# Patient Record
Sex: Female | Born: 1985 | Race: Black or African American | Hispanic: No | Marital: Single | Smoking: Never smoker
Health system: Southern US, Community
[De-identification: ages and names within clinical notes are randomized; demographics above are authoritative.]

## PROBLEM LIST (undated history)

## (undated) DIAGNOSIS — E669 Obesity, unspecified: Secondary | ICD-10-CM

## (undated) DIAGNOSIS — E282 Polycystic ovarian syndrome: Secondary | ICD-10-CM

## (undated) DIAGNOSIS — D649 Anemia, unspecified: Secondary | ICD-10-CM

## (undated) HISTORY — DX: Anemia, unspecified: D64.9

## (undated) HISTORY — DX: Polycystic ovarian syndrome: E28.2

## (undated) HISTORY — PX: TONSILLECTOMY: SUR1361

## (undated) HISTORY — DX: Obesity, unspecified: E66.9

---

## 2019-05-18 DIAGNOSIS — F4323 Adjustment disorder with mixed anxiety and depressed mood: Secondary | ICD-10-CM | POA: Diagnosis not present

## 2019-05-22 DIAGNOSIS — Z20828 Contact with and (suspected) exposure to other viral communicable diseases: Secondary | ICD-10-CM | POA: Diagnosis not present

## 2019-06-02 DIAGNOSIS — F4323 Adjustment disorder with mixed anxiety and depressed mood: Secondary | ICD-10-CM | POA: Diagnosis not present

## 2019-06-15 DIAGNOSIS — L821 Other seborrheic keratosis: Secondary | ICD-10-CM | POA: Diagnosis not present

## 2019-06-15 DIAGNOSIS — E282 Polycystic ovarian syndrome: Secondary | ICD-10-CM | POA: Diagnosis not present

## 2019-06-15 DIAGNOSIS — D239 Other benign neoplasm of skin, unspecified: Secondary | ICD-10-CM | POA: Diagnosis not present

## 2019-06-16 DIAGNOSIS — F4323 Adjustment disorder with mixed anxiety and depressed mood: Secondary | ICD-10-CM | POA: Diagnosis not present

## 2019-06-30 DIAGNOSIS — F331 Major depressive disorder, recurrent, moderate: Secondary | ICD-10-CM | POA: Diagnosis not present

## 2019-07-07 DIAGNOSIS — Z20828 Contact with and (suspected) exposure to other viral communicable diseases: Secondary | ICD-10-CM | POA: Diagnosis not present

## 2019-07-14 DIAGNOSIS — Z01419 Encounter for gynecological examination (general) (routine) without abnormal findings: Secondary | ICD-10-CM | POA: Diagnosis not present

## 2019-07-14 DIAGNOSIS — Z1151 Encounter for screening for human papillomavirus (HPV): Secondary | ICD-10-CM | POA: Diagnosis not present

## 2019-07-14 DIAGNOSIS — Z6841 Body Mass Index (BMI) 40.0 and over, adult: Secondary | ICD-10-CM | POA: Diagnosis not present

## 2019-07-15 DIAGNOSIS — E611 Iron deficiency: Secondary | ICD-10-CM | POA: Diagnosis not present

## 2019-07-15 DIAGNOSIS — Z0000 Encounter for general adult medical examination without abnormal findings: Secondary | ICD-10-CM | POA: Diagnosis not present

## 2019-07-21 DIAGNOSIS — F331 Major depressive disorder, recurrent, moderate: Secondary | ICD-10-CM | POA: Diagnosis not present

## 2019-08-04 DIAGNOSIS — F331 Major depressive disorder, recurrent, moderate: Secondary | ICD-10-CM | POA: Diagnosis not present

## 2019-08-10 DIAGNOSIS — N920 Excessive and frequent menstruation with regular cycle: Secondary | ICD-10-CM | POA: Diagnosis not present

## 2019-08-10 DIAGNOSIS — R9389 Abnormal findings on diagnostic imaging of other specified body structures: Secondary | ICD-10-CM | POA: Diagnosis not present

## 2019-08-17 DIAGNOSIS — F331 Major depressive disorder, recurrent, moderate: Secondary | ICD-10-CM | POA: Diagnosis not present

## 2019-08-24 DIAGNOSIS — R7989 Other specified abnormal findings of blood chemistry: Secondary | ICD-10-CM | POA: Diagnosis not present

## 2019-08-24 DIAGNOSIS — R7303 Prediabetes: Secondary | ICD-10-CM | POA: Diagnosis not present

## 2019-08-24 DIAGNOSIS — R635 Abnormal weight gain: Secondary | ICD-10-CM | POA: Diagnosis not present

## 2019-08-24 DIAGNOSIS — E782 Mixed hyperlipidemia: Secondary | ICD-10-CM | POA: Diagnosis not present

## 2019-08-24 DIAGNOSIS — D509 Iron deficiency anemia, unspecified: Secondary | ICD-10-CM | POA: Diagnosis not present

## 2019-08-24 DIAGNOSIS — E039 Hypothyroidism, unspecified: Secondary | ICD-10-CM | POA: Diagnosis not present

## 2019-08-30 DIAGNOSIS — R7303 Prediabetes: Secondary | ICD-10-CM | POA: Diagnosis not present

## 2019-08-30 DIAGNOSIS — Z1331 Encounter for screening for depression: Secondary | ICD-10-CM | POA: Diagnosis not present

## 2019-08-30 DIAGNOSIS — R635 Abnormal weight gain: Secondary | ICD-10-CM | POA: Diagnosis not present

## 2019-08-30 DIAGNOSIS — E039 Hypothyroidism, unspecified: Secondary | ICD-10-CM | POA: Diagnosis not present

## 2019-08-30 DIAGNOSIS — E782 Mixed hyperlipidemia: Secondary | ICD-10-CM | POA: Diagnosis not present

## 2019-08-30 DIAGNOSIS — Z1339 Encounter for screening examination for other mental health and behavioral disorders: Secondary | ICD-10-CM | POA: Diagnosis not present

## 2019-09-07 DIAGNOSIS — F331 Major depressive disorder, recurrent, moderate: Secondary | ICD-10-CM | POA: Diagnosis not present

## 2019-10-04 DIAGNOSIS — N938 Other specified abnormal uterine and vaginal bleeding: Secondary | ICD-10-CM | POA: Diagnosis not present

## 2019-10-04 DIAGNOSIS — R9389 Abnormal findings on diagnostic imaging of other specified body structures: Secondary | ICD-10-CM | POA: Diagnosis not present

## 2019-10-08 DIAGNOSIS — E282 Polycystic ovarian syndrome: Secondary | ICD-10-CM | POA: Diagnosis not present

## 2019-10-08 DIAGNOSIS — D509 Iron deficiency anemia, unspecified: Secondary | ICD-10-CM | POA: Diagnosis not present

## 2019-10-08 DIAGNOSIS — R7303 Prediabetes: Secondary | ICD-10-CM | POA: Diagnosis not present

## 2019-11-26 DIAGNOSIS — Z20822 Contact with and (suspected) exposure to covid-19: Secondary | ICD-10-CM | POA: Diagnosis not present

## 2019-12-06 DIAGNOSIS — F331 Major depressive disorder, recurrent, moderate: Secondary | ICD-10-CM | POA: Diagnosis not present

## 2020-01-03 DIAGNOSIS — E7800 Pure hypercholesterolemia, unspecified: Secondary | ICD-10-CM | POA: Diagnosis not present

## 2020-03-03 DIAGNOSIS — L03116 Cellulitis of left lower limb: Secondary | ICD-10-CM | POA: Diagnosis not present

## 2020-03-09 DIAGNOSIS — D649 Anemia, unspecified: Secondary | ICD-10-CM | POA: Diagnosis not present

## 2020-03-09 DIAGNOSIS — Z0000 Encounter for general adult medical examination without abnormal findings: Secondary | ICD-10-CM | POA: Diagnosis not present

## 2020-03-09 DIAGNOSIS — E7800 Pure hypercholesterolemia, unspecified: Secondary | ICD-10-CM | POA: Diagnosis not present

## 2020-03-09 DIAGNOSIS — R7303 Prediabetes: Secondary | ICD-10-CM | POA: Diagnosis not present

## 2020-03-16 DIAGNOSIS — F438 Other reactions to severe stress: Secondary | ICD-10-CM | POA: Diagnosis not present

## 2020-03-23 DIAGNOSIS — F438 Other reactions to severe stress: Secondary | ICD-10-CM | POA: Diagnosis not present

## 2020-03-30 DIAGNOSIS — F438 Other reactions to severe stress: Secondary | ICD-10-CM | POA: Diagnosis not present

## 2020-04-06 DIAGNOSIS — F438 Other reactions to severe stress: Secondary | ICD-10-CM | POA: Diagnosis not present

## 2020-04-13 DIAGNOSIS — F438 Other reactions to severe stress: Secondary | ICD-10-CM | POA: Diagnosis not present

## 2020-04-27 DIAGNOSIS — F438 Other reactions to severe stress: Secondary | ICD-10-CM | POA: Diagnosis not present

## 2020-05-04 DIAGNOSIS — F438 Other reactions to severe stress: Secondary | ICD-10-CM | POA: Diagnosis not present

## 2020-05-11 DIAGNOSIS — F438 Other reactions to severe stress: Secondary | ICD-10-CM | POA: Diagnosis not present

## 2020-05-16 DIAGNOSIS — Z20822 Contact with and (suspected) exposure to covid-19: Secondary | ICD-10-CM | POA: Diagnosis not present

## 2020-05-20 DIAGNOSIS — F438 Other reactions to severe stress: Secondary | ICD-10-CM | POA: Diagnosis not present

## 2020-05-27 DIAGNOSIS — F438 Other reactions to severe stress: Secondary | ICD-10-CM | POA: Diagnosis not present

## 2020-05-29 DIAGNOSIS — R946 Abnormal results of thyroid function studies: Secondary | ICD-10-CM | POA: Diagnosis not present

## 2020-05-29 DIAGNOSIS — R7303 Prediabetes: Secondary | ICD-10-CM | POA: Diagnosis not present

## 2020-05-29 DIAGNOSIS — E7800 Pure hypercholesterolemia, unspecified: Secondary | ICD-10-CM | POA: Diagnosis not present

## 2020-05-29 DIAGNOSIS — D649 Anemia, unspecified: Secondary | ICD-10-CM | POA: Diagnosis not present

## 2020-06-03 DIAGNOSIS — F438 Other reactions to severe stress: Secondary | ICD-10-CM | POA: Diagnosis not present

## 2020-06-19 DIAGNOSIS — H5213 Myopia, bilateral: Secondary | ICD-10-CM | POA: Diagnosis not present

## 2020-06-19 DIAGNOSIS — H5212 Myopia, left eye: Secondary | ICD-10-CM | POA: Diagnosis not present

## 2020-06-19 DIAGNOSIS — H52223 Regular astigmatism, bilateral: Secondary | ICD-10-CM | POA: Diagnosis not present

## 2020-06-23 DIAGNOSIS — F438 Other reactions to severe stress: Secondary | ICD-10-CM | POA: Diagnosis not present

## 2020-08-18 DIAGNOSIS — Z20822 Contact with and (suspected) exposure to covid-19: Secondary | ICD-10-CM | POA: Diagnosis not present

## 2020-08-18 DIAGNOSIS — Z03818 Encounter for observation for suspected exposure to other biological agents ruled out: Secondary | ICD-10-CM | POA: Diagnosis not present

## 2020-08-30 DIAGNOSIS — F438 Other reactions to severe stress: Secondary | ICD-10-CM | POA: Diagnosis not present

## 2020-09-13 DIAGNOSIS — F438 Other reactions to severe stress: Secondary | ICD-10-CM | POA: Diagnosis not present

## 2020-09-27 DIAGNOSIS — Z20822 Contact with and (suspected) exposure to covid-19: Secondary | ICD-10-CM | POA: Diagnosis not present

## 2020-09-28 DIAGNOSIS — Z20822 Contact with and (suspected) exposure to covid-19: Secondary | ICD-10-CM | POA: Diagnosis not present

## 2020-11-30 DIAGNOSIS — N920 Excessive and frequent menstruation with regular cycle: Secondary | ICD-10-CM | POA: Diagnosis not present

## 2020-11-30 DIAGNOSIS — R9389 Abnormal findings on diagnostic imaging of other specified body structures: Secondary | ICD-10-CM | POA: Diagnosis not present

## 2020-11-30 DIAGNOSIS — Z6841 Body Mass Index (BMI) 40.0 and over, adult: Secondary | ICD-10-CM | POA: Diagnosis not present

## 2020-11-30 DIAGNOSIS — E282 Polycystic ovarian syndrome: Secondary | ICD-10-CM | POA: Diagnosis not present

## 2021-02-07 DIAGNOSIS — M79671 Pain in right foot: Secondary | ICD-10-CM | POA: Diagnosis not present

## 2021-02-07 DIAGNOSIS — R600 Localized edema: Secondary | ICD-10-CM | POA: Diagnosis not present

## 2021-02-08 DIAGNOSIS — R600 Localized edema: Secondary | ICD-10-CM | POA: Diagnosis not present

## 2021-03-22 DIAGNOSIS — L309 Dermatitis, unspecified: Secondary | ICD-10-CM | POA: Diagnosis not present

## 2021-03-22 DIAGNOSIS — L819 Disorder of pigmentation, unspecified: Secondary | ICD-10-CM | POA: Diagnosis not present

## 2021-05-08 ENCOUNTER — Other Ambulatory Visit: Payer: Self-pay | Admitting: Obstetrics and Gynecology

## 2021-05-08 DIAGNOSIS — N938 Other specified abnormal uterine and vaginal bleeding: Secondary | ICD-10-CM

## 2021-05-22 ENCOUNTER — Ambulatory Visit
Admission: RE | Admit: 2021-05-22 | Discharge: 2021-05-22 | Disposition: A | Discharge status: Home / Self Care | Payer: 59 | Source: Ambulatory Visit | Attending: Obstetrics and Gynecology | Admitting: Obstetrics and Gynecology

## 2021-05-22 DIAGNOSIS — N938 Other specified abnormal uterine and vaginal bleeding: Secondary | ICD-10-CM

## 2021-07-11 ENCOUNTER — Ambulatory Visit (HOSPITAL_BASED_OUTPATIENT_CLINIC_OR_DEPARTMENT_OTHER): Payer: 59 | Admitting: Cardiovascular Disease

## 2021-07-18 ENCOUNTER — Ambulatory Visit (HOSPITAL_BASED_OUTPATIENT_CLINIC_OR_DEPARTMENT_OTHER): Payer: 59 | Admitting: Cardiovascular Disease

## 2021-08-30 ENCOUNTER — Ambulatory Visit (HOSPITAL_BASED_OUTPATIENT_CLINIC_OR_DEPARTMENT_OTHER): Payer: 59 | Admitting: Cardiovascular Disease

## 2021-08-30 NOTE — Progress Notes (Incomplete)
Cardiology Office Note:    Date:  08/30/2021   ID:  Taylor Briggs, DOB 26-Jun-1986, MRN [id number]  PCP:  Renford Dills, MD   [facility] Providers Cardiologist:  None { Click to update primary MD,subspecialty MD or APP then REFRESH:1}    Referring MD: Maxie Better, MD   No chief complaint on file.  History of Present Illness:    Taylor Briggs is a 35 y.o. female with a hx of anemia, here for the evaluation of hypertension.  She was seen at [facility] ED on 05/14/2021 complaining of constant shortness of breath for 3 days prior.  Today,  She denies any palpitations, chest pain, or shortness of breath. No lightheadedness, headaches, syncope, orthopnea, PND, lower extremity edema or exertional symptoms.  (+)  No past medical history on file.  *** The histories are not reviewed yet. Please review them in the "History" navigator section and refresh this SmartLink.  Current Medications: No outpatient medications have been marked as taking for the 08/30/21 encounter (Appointment) with Chilton Si, MD.     Allergies:   Patient has no allergy information on record.   Social History   Socioeconomic History   Marital status: Single    Spouse name: Not on file   Number of children: Not on file   Years of education: Not on file   Highest education level: Not on file  Occupational History   Not on file  Tobacco Use   Smoking status: Not on file   Smokeless tobacco: Not on file  Substance and Sexual Activity   Alcohol use: Not on file   Drug use: Not on file   Sexual activity: Not on file  Other Topics Concern   Not on file  Social History Narrative   Not on file   Social Determinants of Health   Financial Resource Strain: Not on file  Food Insecurity: Not on file  Transportation Needs: Not on file  Physical Activity: Not on file  Stress: Not on file  Social Connections: Not on file     Family History: The patient's family  history is not on file.  ROS:   Please see the history of present illness.     All other systems reviewed and are negative.  EKGs/Labs/Other Studies Reviewed:    The following studies were reviewed today:  CXR ([facility]) 05/14/2021: INDICATION: Shortness of breath.   COMPARISON: No similar studies are available at this institution for comparison   VIEWS: 2   FINDINGS:   LUNGS/PLEURA: Clear. No effusions. Mild elevation of the right hemidiaphragm.   HEART/MEDIASTINUM: The cardiomediastinal silhouette is mildly enlarged.   LIFE SUPPORT LINES: None.   PNEUMOTHORAX: None.   OSSEOUS STRUCTURES: No acute displaced fracture.   SOFT TISSUES: Large body habitus.   IMPRESSION:  Mild enlargement of the cardiomediastinal silhouette. The lungs are clear.   EKG:    08/30/2021: Sinus ***. Rate *** bpm.  Recent Labs: No results found for requested labs within last 8760 hours.   Recent Lipid Panel No results found for: CHOL, TRIG, HDL, CHOLHDL, VLDL, LDLCALC, LDLDIRECT   Risk Assessment/Calculations:   {Does this patient have ATRIAL FIBRILLATION?:[phone]}       Physical Exam:    Wt Readings from Last 3 Encounters:  No data found for Wt     VS:  There were no vitals taken for this visit. , BMI There is no height or weight on file to calculate BMI. GENERAL:  Well appearing HEENT: Pupils equal  round and reactive, fundi not visualized, oral mucosa unremarkable NECK:  No jugular venous distention, waveform within normal limits, carotid upstroke brisk and symmetric, no bruits, no thyromegaly LYMPHATICS:  No cervical adenopathy LUNGS:  Clear to auscultation bilaterally HEART:  RRR.  PMI not displaced or sustained,S1 and S2 within normal limits, no S3, no S4, no clicks, no rubs, *** murmurs ABD:  Flat, positive bowel sounds normal in frequency in pitch, no bruits, no rebound, no guarding, no midline pulsatile mass, no hepatomegaly, no splenomegaly EXT:  2 plus pulses  throughout, no edema, no cyanosis no clubbing SKIN:  No rashes no nodules NEURO:  Cranial nerves II through XII grossly intact, motor grossly intact throughout PSYCH:  Cognitively intact, oriented to person place and time   ASSESSMENT:    No diagnosis found. PLAN:    No problem-specific Assessment & Plan notes found for this encounter.    {Are you ordering a CV Procedure (e.g. stress test, cath, DCCV, TEE, etc)?   Press F2        :[id number]}   Disposition: FU with Tiffany C. Duke Salvia, MD, [facility] in ***  Medication Adjustments/Labs and Tests Ordered: Current medicines are reviewed at length with the patient today.  Concerns regarding medicines are outlined above.   No orders of the defined types were placed in this encounter.  No orders of the defined types were placed in this encounter.  There are no Patient Instructions on file for this visit.   I,Mathew Stumpf,acting as a Neurosurgeon for Chilton Si, MD.,have documented all relevant documentation on the behalf of Chilton Si, MD,as directed by  Chilton Si, MD while in the presence of Chilton Si, MD.  ***  Signed, Carlena Bjornstad  08/30/2021 10:05 AM    [facility]

## 2021-09-28 ENCOUNTER — Encounter (HOSPITAL_BASED_OUTPATIENT_CLINIC_OR_DEPARTMENT_OTHER): Payer: Self-pay

## 2021-09-28 DIAGNOSIS — E282 Polycystic ovarian syndrome: Secondary | ICD-10-CM | POA: Insufficient documentation

## 2021-09-28 DIAGNOSIS — E7800 Pure hypercholesterolemia, unspecified: Secondary | ICD-10-CM | POA: Insufficient documentation

## 2021-09-28 DIAGNOSIS — R9389 Abnormal findings on diagnostic imaging of other specified body structures: Secondary | ICD-10-CM | POA: Insufficient documentation

## 2021-09-28 DIAGNOSIS — D509 Iron deficiency anemia, unspecified: Secondary | ICD-10-CM | POA: Insufficient documentation

## 2021-10-08 ENCOUNTER — Telehealth (HOSPITAL_BASED_OUTPATIENT_CLINIC_OR_DEPARTMENT_OTHER): Payer: Self-pay | Admitting: *Deleted

## 2021-10-08 NOTE — Telephone Encounter (Signed)
Patient scheduled in regular clinic instead of ADV HTN clinic Left message to call back

## 2021-10-09 NOTE — Telephone Encounter (Signed)
Patient has been moved

## 2021-10-12 ENCOUNTER — Ambulatory Visit (HOSPITAL_BASED_OUTPATIENT_CLINIC_OR_DEPARTMENT_OTHER): Payer: 59 | Admitting: Cardiovascular Disease

## 2021-10-19 NOTE — Telephone Encounter (Signed)
Patient scheduled 2/15

## 2021-11-14 ENCOUNTER — Encounter (HOSPITAL_BASED_OUTPATIENT_CLINIC_OR_DEPARTMENT_OTHER): Payer: Self-pay | Admitting: Cardiovascular Disease

## 2021-11-14 ENCOUNTER — Other Ambulatory Visit: Payer: Self-pay

## 2021-11-14 ENCOUNTER — Ambulatory Visit (HOSPITAL_BASED_OUTPATIENT_CLINIC_OR_DEPARTMENT_OTHER): Payer: 59 | Admitting: Cardiovascular Disease

## 2021-11-14 VITALS — BP 164/86 | HR 93 | Ht 67.0 in | Wt >= 6400 oz

## 2021-11-14 DIAGNOSIS — R400 Somnolence: Secondary | ICD-10-CM

## 2021-11-14 DIAGNOSIS — I10 Essential (primary) hypertension: Secondary | ICD-10-CM | POA: Diagnosis not present

## 2021-11-14 DIAGNOSIS — K219 Gastro-esophageal reflux disease without esophagitis: Secondary | ICD-10-CM | POA: Diagnosis not present

## 2021-11-14 DIAGNOSIS — R0683 Snoring: Secondary | ICD-10-CM

## 2021-11-14 HISTORY — DX: Gastro-esophageal reflux disease without esophagitis: K21.9

## 2021-11-14 NOTE — Assessment & Plan Note (Addendum)
BMI 67.  Referral to [facility].  She will consider enrolling.  She is going to work on not eating out as much.  She does not think she is able to exercise now due to chronic fatigue from menorrhagia.  Check a TSH and cortisol.

## 2021-11-14 NOTE — Assessment & Plan Note (Signed)
Symptoms controlled with avoiding spicy foods.

## 2021-11-14 NOTE — Patient Instructions (Signed)
Medication Instructions:  Your physician recommends that you continue on your current medications as directed. Please refer to the Current Medication list given to you today.    Labwork: FASTING LP/CMET/A1C/TSH SOON    Testing/Procedures: HOME SLEEP STUDY  Follow-Up: 12/12/2021  3:00 PM WITH PHARM D AT [facility] OFFICE   Referrals:  [facility]    Special Instructions:  MONITOR YOUR BLOOD PRESSURE TWICE A DAY, LOG IN THE BOOK PROVIDED. BRING THE BOOK AND YOUR BLOOD PRESSURE MACHINE TO YOUR FOLLOW UP IN 1 MONTH    DASH Eating Plan DASH stands for "Dietary Approaches to Stop Hypertension." The DASH eating plan is a healthy eating plan that has been shown to reduce high blood pressure (hypertension). It may also reduce your risk for type 2 diabetes, heart disease, and stroke. The DASH eating plan may also help with weight loss. What are tips for following this plan?  General guidelines Avoid eating more than 2,300 mg (milligrams) of salt (sodium) a day. If you have hypertension, you may need to reduce your sodium intake to 1,500 mg a day. Limit alcohol intake to no more than 1 drink a day for nonpregnant women and 2 drinks a day for men. One drink equals 12 oz of beer, 5 oz of wine, or 1 oz of hard liquor. Work with your health care provider to maintain a healthy body weight or to lose weight. Ask what an ideal weight is for you. Get at least 30 minutes of exercise that causes your heart to beat faster (aerobic exercise) most days of the week. Activities may include walking, swimming, or biking. Work with your health care provider or diet and nutrition specialist (dietitian) to adjust your eating plan to your individual calorie needs. Reading food labels  Check food labels for the amount of sodium per serving. Choose foods with less than 5 percent of the Daily Value of sodium. Generally, foods with less than 300 mg of sodium per serving fit into this eating plan. To find  whole grains, look for the word "whole" as the first word in the ingredient list. Shopping Buy products labeled as "low-sodium" or "no salt added." Buy fresh foods. Avoid canned foods and premade or frozen meals. Cooking Avoid adding salt when cooking. Use salt-free seasonings or herbs instead of table salt or sea salt. Check with your health care provider or pharmacist before using salt substitutes. Do not fry foods. Cook foods using healthy methods such as baking, boiling, grilling, and broiling instead. Cook with heart-healthy oils, such as olive, canola, soybean, or sunflower oil. Meal planning Eat a balanced diet that includes: 5 or more servings of fruits and vegetables each day. At each meal, try to fill half of your plate with fruits and vegetables. Up to 6-8 servings of whole grains each day. Less than 6 oz of lean meat, poultry, or fish each day. A 3-oz serving of meat is about the same size as a deck of cards. One egg equals 1 oz. 2 servings of low-fat dairy each day. A serving of nuts, seeds, or beans 5 times each week. Heart-healthy fats. Healthy fats called Omega-3 fatty acids are found in foods such as flaxseeds and coldwater fish, like sardines, salmon, and mackerel. Limit how much you eat of the following: Canned or prepackaged foods. Food that is high in trans fat, such as fried foods. Food that is high in saturated fat, such as fatty meat. Sweets, desserts, sugary drinks, and other foods with added sugar.  Full-fat dairy products. Do not salt foods before eating. Try to eat at least 2 vegetarian meals each week. Eat more home-cooked food and less restaurant, buffet, and fast food. When eating at a restaurant, ask that your food be prepared with less salt or no salt, if possible. What foods are recommended? The items listed may not be a complete list. Talk with your dietitian about what dietary choices are best for you. Grains Whole-grain or whole-wheat bread.  Whole-grain or whole-wheat pasta. Brown rice. Modena Morrow. Bulgur. Whole-grain and low-sodium cereals. Pita bread. Low-fat, low-sodium crackers. Whole-wheat flour tortillas. Vegetables Fresh or frozen vegetables (raw, steamed, roasted, or grilled). Low-sodium or reduced-sodium tomato and vegetable juice. Low-sodium or reduced-sodium tomato sauce and tomato paste. Low-sodium or reduced-sodium canned vegetables. Fruits All fresh, dried, or frozen fruit. Canned fruit in natural juice (without added sugar). Meat and other protein foods Skinless chicken or [place]. Ground chicken or [place]. Pork with fat trimmed off. Fish and seafood. Egg whites. Dried beans, peas, or lentils. Unsalted nuts, nut butters, and seeds. Unsalted canned beans. Lean cuts of beef with fat trimmed off. Low-sodium, lean deli meat. Dairy Low-fat (1%) or fat-free (skim) milk. Fat-free, low-fat, or reduced-fat cheeses. Nonfat, low-sodium ricotta or cottage cheese. Low-fat or nonfat yogurt. Low-fat, low-sodium cheese. Fats and oils Soft margarine without trans fats. Vegetable oil. Low-fat, reduced-fat, or light mayonnaise and salad dressings (reduced-sodium). Canola, safflower, olive, soybean, and sunflower oils. Avocado. Seasoning and other foods Herbs. Spices. Seasoning mixes without salt. Unsalted popcorn and pretzels. Fat-free sweets. What foods are not recommended? The items listed may not be a complete list. Talk with your dietitian about what dietary choices are best for you. Grains Baked goods made with fat, such as croissants, muffins, or some breads. Dry pasta or rice meal packs. Vegetables Creamed or fried vegetables. Vegetables in a cheese sauce. Regular canned vegetables (not low-sodium or reduced-sodium). Regular canned tomato sauce and paste (not low-sodium or reduced-sodium). Regular tomato and vegetable juice (not low-sodium or reduced-sodium). Angie Fava. Olives. Fruits Canned fruit in a light or heavy syrup.  Fried fruit. Fruit in cream or butter sauce. Meat and other protein foods Fatty cuts of meat. Ribs. Fried meat. Berniece Salines. Sausage. Bologna and other processed lunch meats. Salami. Fatback. Hotdogs. Bratwurst. Salted nuts and seeds. Canned beans with added salt. Canned or smoked fish. Whole eggs or egg yolks. Chicken or [place] with skin. Dairy Whole or 2% milk, cream, and half-and-half. Whole or full-fat cream cheese. Whole-fat or sweetened yogurt. Full-fat cheese. Nondairy creamers. Whipped toppings. Processed cheese and cheese spreads. Fats and oils Butter. Stick margarine. Lard. Shortening. Ghee. Bacon fat. Tropical oils, such as coconut, palm kernel, or palm oil. Seasoning and other foods Salted popcorn and pretzels. Onion salt, garlic salt, seasoned salt, table salt, and sea salt. Worcestershire sauce. Tartar sauce. Barbecue sauce. Teriyaki sauce. Soy sauce, including reduced-sodium. Steak sauce. Canned and packaged gravies. Fish sauce. Oyster sauce. Cocktail sauce. Horseradish that you find on the shelf. Ketchup. Mustard. Meat flavorings and tenderizers. Bouillon cubes. Hot sauce and Tabasco sauce. Premade or packaged marinades. Premade or packaged taco seasonings. Relishes. Regular salad dressings. Where to find more information: National Heart, Lung, and [place]: https://wilson-eaton.com/ American Heart Association: www.heart.org Summary The DASH eating plan is a healthy eating plan that has been shown to reduce high blood pressure (hypertension). It may also reduce your risk for type 2 diabetes, heart disease, and stroke. With the DASH eating plan, you should limit salt (sodium) intake to 2,300 mg  a day. If you have hypertension, you may need to reduce your sodium intake to 1,500 mg a day. When on the DASH eating plan, aim to eat more fresh fruits and vegetables, whole grains, lean proteins, low-fat dairy, and heart-healthy fats. Work with your health care provider or diet and nutrition  specialist (dietitian) to adjust your eating plan to your individual calorie needs. This information is not intended to replace advice given to you by your health care provider. Make sure you discuss any questions you have with your health care provider. Document Released: 09/05/2011 Document Revised: 08/29/2017 Document Reviewed: 09/09/2016 Elsevier Patient Education  2020 [employer].

## 2021-11-14 NOTE — Progress Notes (Incomplete)
Advanced Hypertension Clinic Initial Assessment:    Date:  11/14/2021   ID:  Taylor Briggs, DOB [birth year], MRN [id number]  PCP:  Seward Carol, MD  Cardiologist:  None  Nephrologist:  Referring MD: Servando Salina, MD   CC: Hypertension  History of Present Illness:    Taylor Briggs is a 36 y.o. female with a hx of hypertension, morbid obesity and PCOS here to establish care in the Advanced Hypertension Clinic. She saw Dr Garwin Brothers 04/2021 and blood pressure was 187/76 Dr Garwin Brothers started amlodipine and was referred here.  She had heard her blood pressure was elevated within the last year but does not check her blood pressure at home. She notes that she feels very tired especially on days she has not taken her iron supplements. Her menstrual cycles are very heavy and cause fatigue. She does not exercise because she is at work most of the time and finds it hard to move around due to lower back pain. She reports she started having breathing problems last year October but thought it was due to her apartment. They have improved but not resolved completely. Dr Delfina Redwood informed her she had LE edema. She denies chest pain, shortness of breath on exertion, palpitations, lightheadedness, headaches, syncope, orthopnea, PND. She wakes up often at night but thinks it is due to acid reflux. She snores but wakes up feeling rested and endorses somnolence during the day. She eats a lot of take out at work. She does not consume caffeine and rarely drinks alcohol. She does not take medication to manage her pain but uses Excedrin for migraines. She was prescribed amlodipine but never started it.  She reports she had a sleep study in 2017/2018 that did not show sleep apnea. She notes significant weight gain since that time.     Previous antihypertensives: None   Past Medical History:  Diagnosis Date   Anemia    GERD (gastroesophageal reflux disease) 11/14/2021   Obesity    PCOS (polycystic ovarian  syndrome)     Past Surgical History:  Procedure Laterality Date   TONSILLECTOMY     age 52    Current Medications: Current Meds  Medication Sig   Cholecalciferol (VITAMIN D) 50 MCG (2000 UT) CAPS Take 1 capsule by mouth daily at 12 noon.   ferrous sulfate 325 (65 FE) MG tablet Take 1 tablet by mouth daily at 12 noon.     Allergies:   Pineapple   Social History   Socioeconomic History   Marital status: Single    Spouse name: Not on file   Number of children: Not on file   Years of education: Not on file   Highest education level: Not on file  Occupational History   Not on file  Tobacco Use   Smoking status: Never   Smokeless tobacco: Never  Substance and Sexual Activity   Alcohol use: Yes    Comment: Rarely   Drug use: Never   Sexual activity: Not on file  Other Topics Concern   Not on file  Social History Narrative   Not on file   Social Determinants of Health   Financial Resource Strain: Low Risk    Difficulty of Paying Living Expenses: Not hard at all  Food Insecurity: No Food Insecurity   Worried About Running Out of Food in the Last Year: Never true   [place] in the Last Year: Never true  Transportation Needs: No Transportation Needs   Lack of Transportation (  Medical): No   Lack of Transportation (Non-Medical): No  Physical Activity: Inactive   Days of Exercise per Week: 0 days   Minutes of Exercise per Session: 0 min  Stress: Not on file  Social Connections: Not on file     Family History: The patient's family history includes Heart failure in her maternal grandmother; Hypertension in her maternal grandfather, maternal grandmother, mother, and paternal grandmother; Obesity in her maternal grandmother; Stroke in her maternal grandfather.  ROS:   Please see the history of present illness.  (+) fatigue    All other systems reviewed and are negative.  EKGs/Labs/Other Studies Reviewed:    EKG:  EKG is  ordered today.  11/14/2021: The ekg  ordered today demonstrates sinus rhythm rate- 93 bpm   Recent Labs: No results found for requested labs within last 8760 hours.   Recent Lipid Panel No results found for: CHOL, TRIG, HDL, CHOLHDL, VLDL, LDLCALC, LDLDIRECT  Physical Exam:   VS:  BP (!) 164/86 (BP Location: Left Arm, Patient Position: Sitting, Cuff Size: Large)    Pulse 93    Ht 5\' 7"  (1.702 m)    Wt (!) 465 lb 9.6 oz (211.2 kg)    BMI 72.92 kg/m  , BMI Body mass index is 72.92 kg/m. GENERAL:  Well appearing HEENT: Pupils equal round and reactive, fundi not visualized, oral mucosa unremarkable NECK:  No jugular venous distention, waveform within normal limits, carotid upstroke brisk and symmetric, no bruits, no thyromegaly LYMPHATICS:  No cervical adenopathy LUNGS:  Clear to auscultation bilaterally HEART:  RRR.  PMI not displaced or sustained,S1 and S2 within normal limits, no S3, no S4, no clicks, no rubs or  murmurs ABD:  Flat, positive bowel sounds normal in frequency in pitch, no bruits, no rebound, no guarding, no midline pulsatile mass, no hepatomegaly, no splenomegaly EXT:  2 plus pulses throughout, no edema, no cyanosis no clubbing SKIN:  No rashes no nodules NEURO:  Cranial nerves II through XII grossly intact, motor grossly intact throughout PSYCH:  Cognitively intact, oriented to person place and time   ASSESSMENT/PLAN:    GERD (gastroesophageal reflux disease) Symptoms controlled with avoiding spicy foods.   Morbid obesity (Advance) BMI 67.  Referral to [facility].  She will consider enrolling.  She is going to work on not eating out as much.  She does not think she is able to exercise now due to chronic fatigue from menorrhagia.  Check a TSH and cortisol.   Screening for Secondary Hypertension: { Click here to document screening for secondary causes of HTN  :[id number] Causes 11/14/2021  Drugs/Herbals Screened     - Comments High sodium intake.  mostly eats out. Minimal caffeine and EtOH.   No NSAIDs  Renovascular HTN Not Screened     - Comments Unabel to get renal artery Dopplers 2/2 body habitus  Sleep Apnea Screened     - Comments Willing to get HST.  She has snoring and apnea  Thyroid Disease Screened     - Comments Check TSH  Hyperaldosteronism Screened     - Comments Check renin/aldosterone  Pheochromocytoma N/A  Cushing's Syndrome Screened     - Comments check cortisol  Hyperparathyroidism Screened  Coarctation of the Aorta Screened     - Comments BP symmetric  Compliance Screened     - Comments uninterested in medications    Relevant Labs/Studies:       Disposition:    FU with MD/PharmD in 1 month  Medication Adjustments/Labs and Tests Ordered: Current medicines are reviewed at length with the patient today.  Concerns regarding medicines are outlined above.  Orders Placed This Encounter  Procedures   TSH   Lipid panel   Comprehensive metabolic panel   HgB 123456   Aldosterone + renin activity w/ ratio   Cortisol   Ambulatory referral to [facility]   EKG 12-Lead   No orders of the defined types were placed in this encounter.   I,Zite Okoli,acting as a Education administrator for Skeet Latch, MD.,have documented all relevant documentation on the behalf of Skeet Latch, MD,as directed by  Skeet Latch, MD while in the presence of Skeet Latch, MD.   ***  Signed, Skeet Latch, MD  11/14/2021 4:00 PM    [place]

## 2021-11-27 DIAGNOSIS — Z0289 Encounter for other administrative examinations: Secondary | ICD-10-CM

## 2021-12-05 ENCOUNTER — Encounter (INDEPENDENT_AMBULATORY_CARE_PROVIDER_SITE_OTHER): Payer: Self-pay

## 2021-12-05 ENCOUNTER — Ambulatory Visit (INDEPENDENT_AMBULATORY_CARE_PROVIDER_SITE_OTHER): Payer: Self-pay | Admitting: Family Medicine

## 2021-12-05 DIAGNOSIS — Z91199 Patient's noncompliance with other medical treatment and regimen due to unspecified reason: Secondary | ICD-10-CM

## 2021-12-05 NOTE — Progress Notes (Signed)
No show for appt. 

## 2021-12-12 ENCOUNTER — Ambulatory Visit: Payer: 59

## 2021-12-12 ENCOUNTER — Telehealth: Payer: Self-pay

## 2021-12-12 NOTE — Progress Notes (Deleted)
? ? ? ?  12/12/2021 ?Taylor Briggs ?1986-06-29 ?[id number] ? ? ?HPI:  Taylor Briggs is a 36 y.o. female patient of Dr ***, with a PMH below who presents today for hypertension clinic evaluation. ? ?Past Medical History: ?   ?   ?   ?   ?   ?  ? ?Blood Pressure Goal:  130/80 ? ?Current Medications: ? ?Family Hx: ? ?Social Hx:  ? ?Diet:  ? ?Exercise:  ? ?Home BP readings:  ? ?Intolerances:  ? ?Labs:  ? ? ?Wt Readings from Last 3 Encounters:  ?11/14/21 (!) 465 lb 9.6 oz (211.2 kg)  ? ?BP Readings from Last 3 Encounters:  ?11/14/21 (!) 164/86  ? ?Pulse Readings from Last 3 Encounters:  ?11/14/21 93  ? ? ?Current Outpatient Medications  ?Medication Sig Dispense Refill  ? Cholecalciferol (VITAMIN D) 50 MCG (2000 UT) CAPS Take 1 capsule by mouth daily at 12 noon.    ? ferrous sulfate 325 (65 FE) MG tablet Take 1 tablet by mouth daily at 12 noon.    ? ?No current facility-administered medications for this visit.  ? ? ?Allergies  ?Allergen Reactions  ? Pineapple Itching  ? ? ?Past Medical History:  ?Diagnosis Date  ? Anemia   ? GERD (gastroesophageal reflux disease) 11/14/2021  ? Obesity   ? PCOS (polycystic ovarian syndrome)   ? ? ?There were no vitals taken for this visit. ? ?No problem-specific Assessment & Plan notes found for this encounter. ? ? ?Taylor Briggs PharmD CPP [facility] ?[facility] ?[address] ?[place], [place] [address] ?[phone] ?

## 2021-12-12 NOTE — Telephone Encounter (Signed)
Lmom for missed appt  

## 2021-12-19 ENCOUNTER — Ambulatory Visit (INDEPENDENT_AMBULATORY_CARE_PROVIDER_SITE_OTHER): Payer: 59 | Admitting: Family Medicine

## 2022-01-03 ENCOUNTER — Encounter (HOSPITAL_BASED_OUTPATIENT_CLINIC_OR_DEPARTMENT_OTHER): Payer: Self-pay | Admitting: Cardiovascular Disease

## 2022-01-03 NOTE — Assessment & Plan Note (Signed)
Blood pressure has been very elevated in the emergency department, and Dr. Nelta Numbers office, and here today.  Despite this she is uninterested in starting any medications.  She never started the amlodipine that was started by Dr. Cherly Hensen.  Did recommend starting HCTZ given her lower extremity edema but she was uninterested.  She is also not able to exercise due to fatigue from anemia and menorrhagia.  She is going to think about making dietary changes.  She will consider trying the healthy weight and wellness clinic.  For now she is going to track her blood pressures at home twice a day and bring back to follow-up in a month.  She will think about whether or not she would be willing to start medications at that time.  We did discuss the effect of hypertension on her brain, kidneys, and heart.  She is going to think about this.  She will come back and have a TSH, renin, aldosterone, and cortisol levels checked.  We will get hemoglobin A1c, fasting lipids and a CMP at that time. ?

## 2022-01-22 ENCOUNTER — Ambulatory Visit: Payer: 59

## 2022-01-22 NOTE — Progress Notes (Deleted)
Patient ID: Taylor Briggs                 DOB: [birth year]                      MRN: [id number]     HPI: Taylor Briggs is a 36 y.o. female referred by Dr. Oval Linsey to HTN clinic. PMH is significant for anemia, menorrhaia, GERD, obesity, PCOS, HTN, and HLD.    At first visit with Dr Oval Linsey, patient was referred to primary care and lab work was ordered.  Patient did not keep appointment with PCP or have lab work drawn yet.   Current HTN meds: N/A Previously tried: N/A BP goal:   Family History:   Social History:   Diet:   Exercise:   Home BP readings:   Wt Readings from Last 3 Encounters:  11/14/21 (!) 465 lb 9.6 oz (211.2 kg)   BP Readings from Last 3 Encounters:  11/14/21 (!) 164/86   Pulse Readings from Last 3 Encounters:  11/14/21 93    Renal function: CrCl cannot be calculated (No successful lab value found.).  Past Medical History:  Diagnosis Date   Anemia    GERD (gastroesophageal reflux disease) 11/14/2021   Obesity    PCOS (polycystic ovarian syndrome)     Current Outpatient Medications on File Prior to Visit  Medication Sig Dispense Refill   Cholecalciferol (VITAMIN D) 50 MCG (2000 UT) CAPS Take 1 capsule by mouth daily at 12 noon.     ferrous sulfate 325 (65 FE) MG tablet Take 1 tablet by mouth daily at 12 noon.     No current facility-administered medications on file prior to visit.    Allergies  Allergen Reactions   Pineapple Itching     Assessment/Plan:  1. Hypertension -

## 2022-01-23 ENCOUNTER — Ambulatory Visit: Payer: 59

## 2022-01-23 NOTE — Progress Notes (Deleted)
     01/23/2022 Taylor Briggs 20-Jan-1986 [id number]   HPI:  Taylor Briggs is a 36 y.o. female patient of Dr Duke Salvia, with a PMH below who presents today for follow up in the advanced hypertension clinic.  Past Medical History:                   Blood Pressure Goal:  130/80  Current Medications: none  Family Hx:  Social Hx:   Diet:   Exercise:   Home BP readings:   Intolerances:   Labs:    Wt Readings from Last 3 Encounters:  11/14/21 (!) 465 lb 9.6 oz (211.2 kg)   BP Readings from Last 3 Encounters:  11/14/21 (!) 164/86   Pulse Readings from Last 3 Encounters:  11/14/21 93    Current Outpatient Medications  Medication Sig Dispense Refill   Cholecalciferol (VITAMIN D) 50 MCG (2000 UT) CAPS Take 1 capsule by mouth daily at 12 noon.     ferrous sulfate 325 (65 FE) MG tablet Take 1 tablet by mouth daily at 12 noon.     No current facility-administered medications for this visit.    Allergies  Allergen Reactions   Pineapple Itching    Past Medical History:  Diagnosis Date   Anemia    GERD (gastroesophageal reflux disease) 11/14/2021   Obesity    PCOS (polycystic ovarian syndrome)     There were no vitals taken for this visit.  No problem-specific Assessment & Plan notes found for this encounter.   Phillips Hay PharmD CPP [facility] [address] [place], [place] [address] [phone]

## 2023-01-20 DIAGNOSIS — L2084 Intrinsic (allergic) eczema: Secondary | ICD-10-CM | POA: Diagnosis not present

## 2023-01-20 DIAGNOSIS — L739 Follicular disorder, unspecified: Secondary | ICD-10-CM | POA: Diagnosis not present

## 2023-01-30 IMAGING — US US PELVIS COMPLETE WITH TRANSVAGINAL
2 series · 13 of 25 positions shown · non-contrast
Comparison: None

CLINICAL DATA: Disc functional uterine bleeding, chronic, with long
heavy and irregular menstrual cycles, bleeding today, unknown LMP



[Series 1: us pelvis complete with transvaginal · 0.30mm/px · 12 of 60 slices shown (1 of 2)]
[im 1/60]
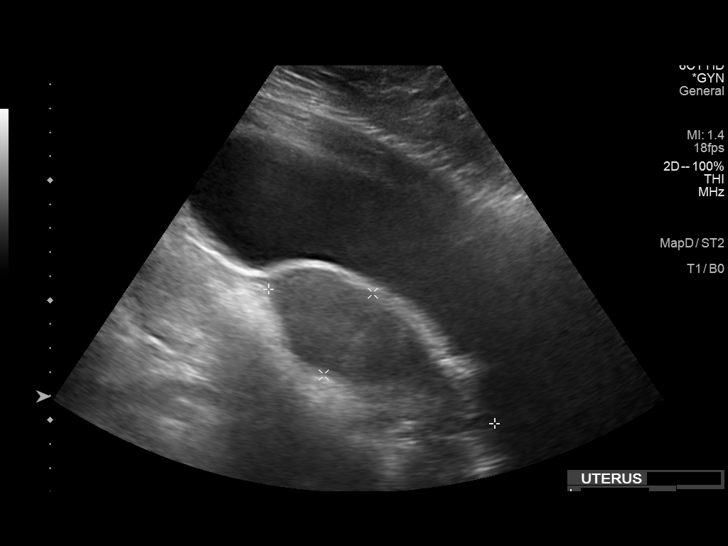
[im 6/60]
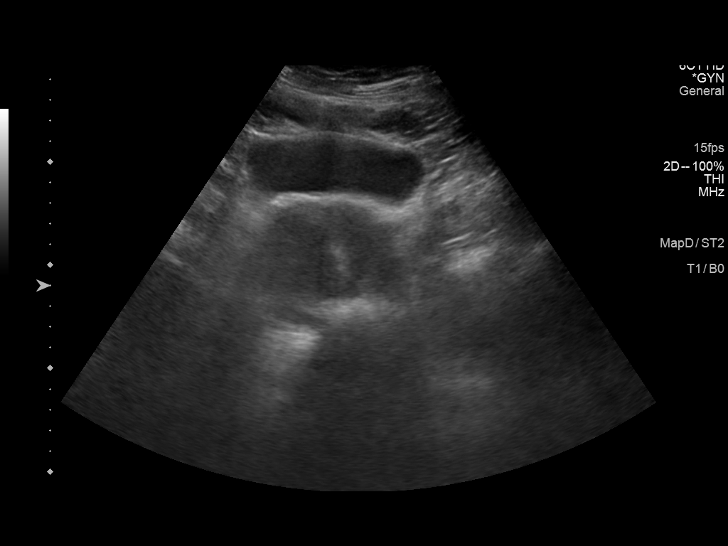
[im 11/60]
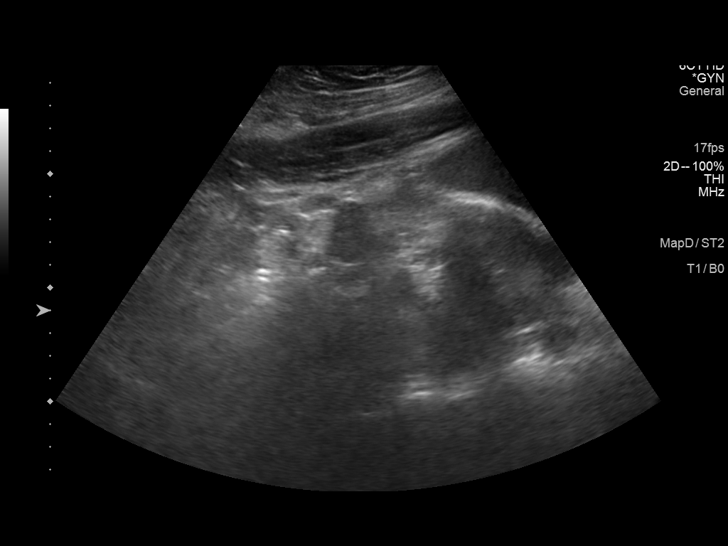
[im 16/60]
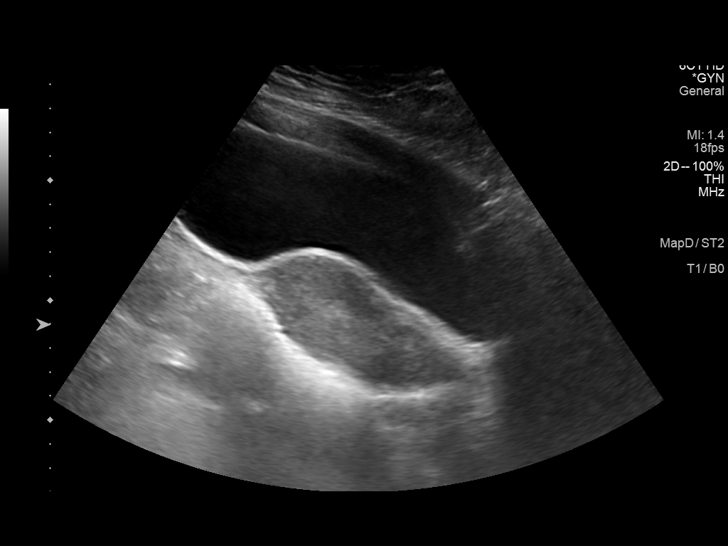
[im 21/60]
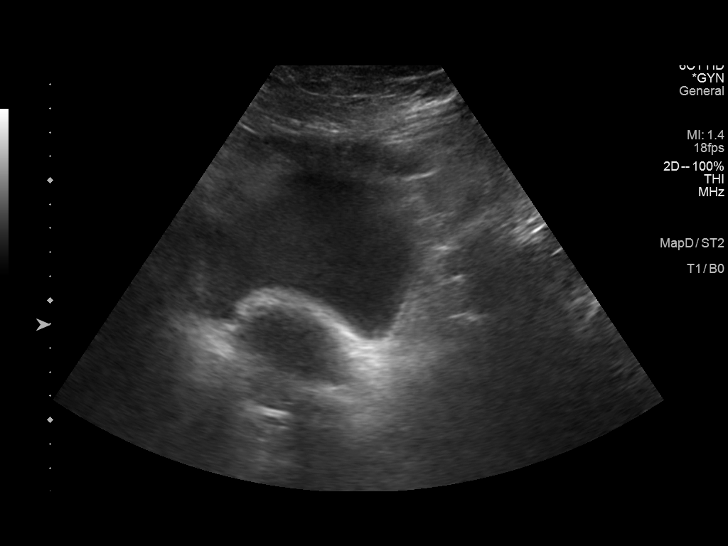
[im 26/60]
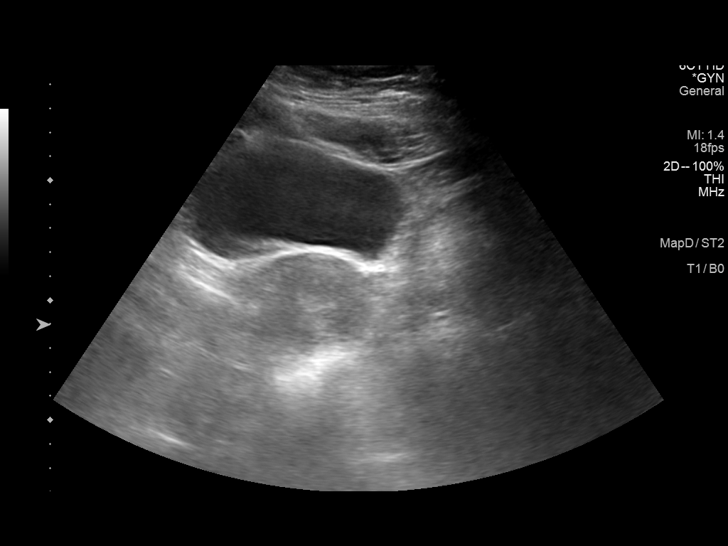
[im 31/60]
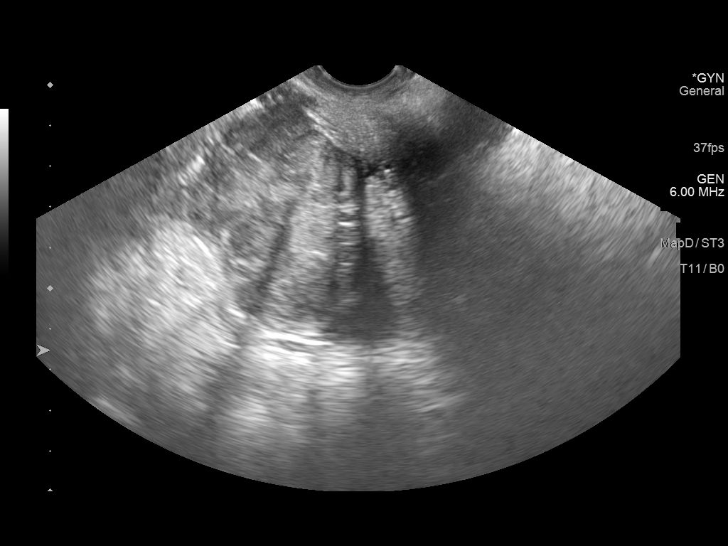
[im 36/60]
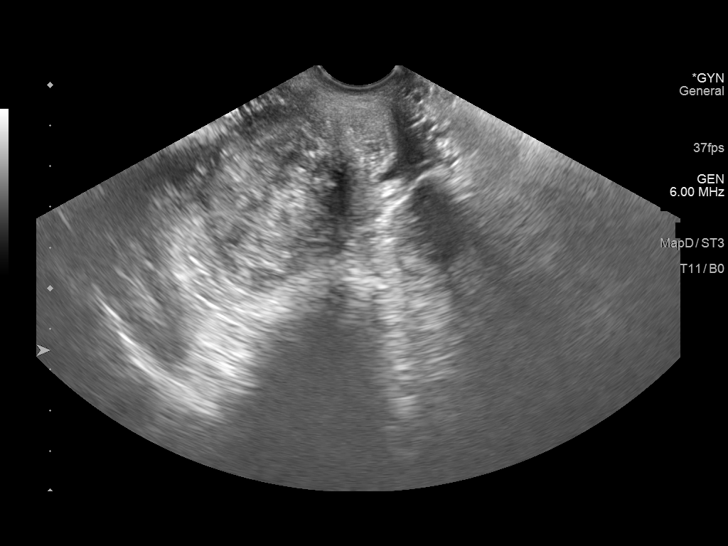
[im 42/60]
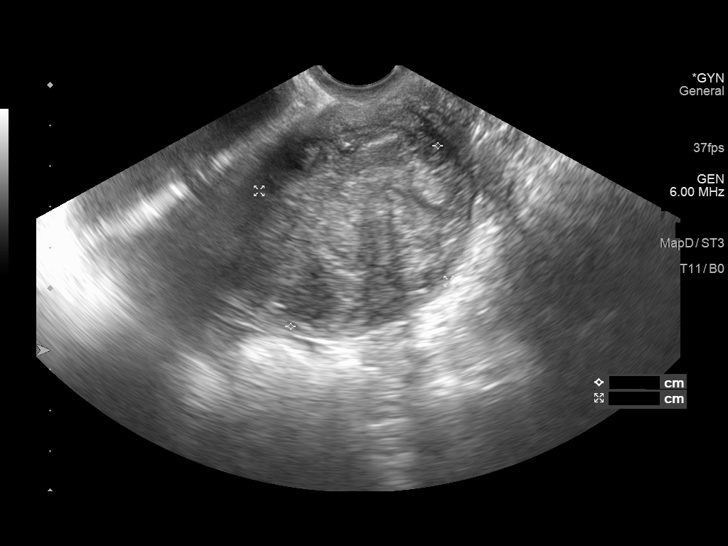
[im 47/60]
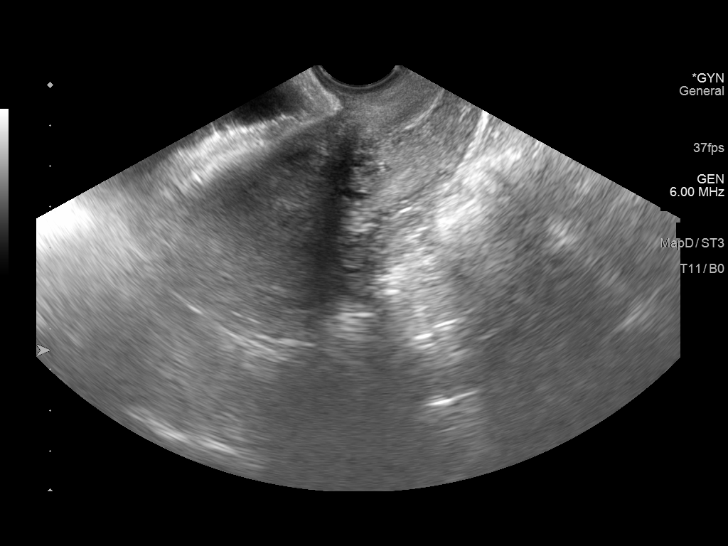
[im 52/60]
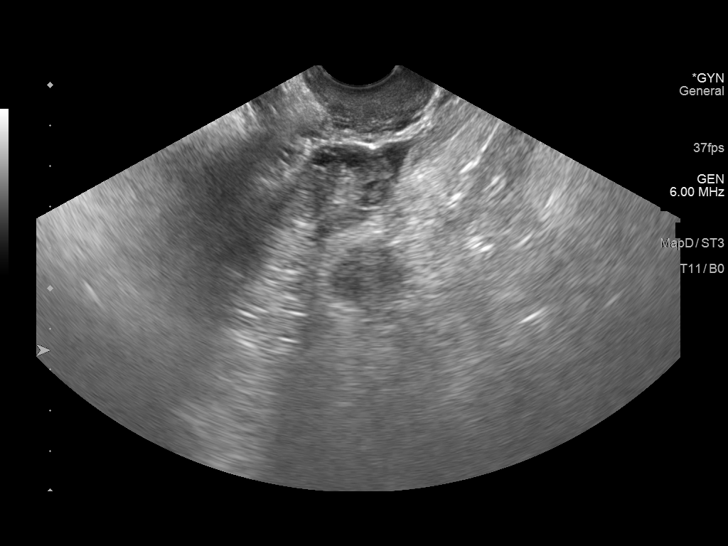
[im 57/60]
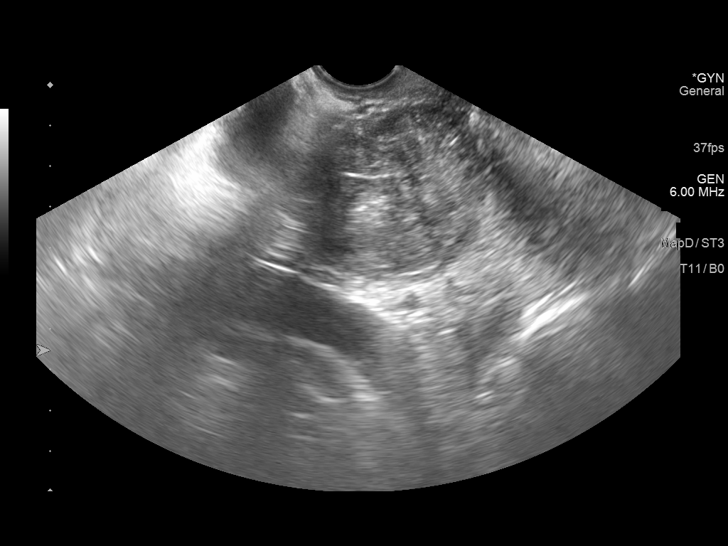

[Series 1001: us pelvis complete with transvaginal · 1 of 2 slices shown (2 of 2)]
[im 1/2]
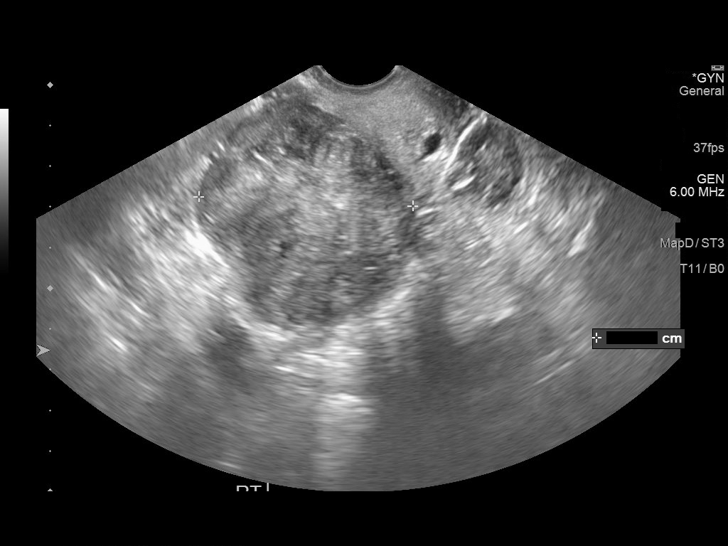

[13 of 25 positions shown; findings below may reference images not displayed]

FINDINGS: Uterus

Measurements: 11.0 x 4.0 x 5.6 cm = volume: 12.8 mL. Anteverted.
Heterogeneous myometrium with scattered areas of shadowing.
Ill-defined endometrial complex margins. Cannot exclude adenomyosis
with this appearance. RIGHT-sided mass suboptimally visualized,
x 5.2 x 5.3 cm question leiomyoma versus focal adenomyosis. No
additional masses.

Endometrium

Thickness: Probably 16 mm.  No endometrial fluid

Right ovary

Measurements: 2.7 x 1.9 x 2.5 cm = volume: 6.8 mL. Normal morphology
without mass

Left ovary

Measurements: 3.3 x 2.2 x 1.8 cm = volume: 6.6 mL. Normal morphology
without mass

Other findings

No free pelvic fluid.  No other pelvic masses.
IMPRESSION: Heterogeneous myometrium with shadowing and ill definition of the
endometrial complex margins, question adenomyosis.

Question 5.7 cm diameter leiomyoma versus focal adenomyosis.

Endometrial complex 16 mm thick; if bleeding remains unresponsive to
hormonal or medical therapy, focal lesion work-up with
sonohysterogram should be considered. Endometrial biopsy should also
be considered in pre-menopausal patients at high risk for
endometrial carcinoma. (Ref: Radiological Reasoning: Algorithmic
Workup of Abnormal Vaginal Bleeding with Endovaginal Sonography and
Sonohysterography. AJR [id number]; [id number])

## 2023-07-23 DIAGNOSIS — R4189 Other symptoms and signs involving cognitive functions and awareness: Secondary | ICD-10-CM | POA: Diagnosis not present

## 2023-07-23 DIAGNOSIS — R0683 Snoring: Secondary | ICD-10-CM | POA: Diagnosis not present

## 2023-07-23 DIAGNOSIS — D509 Iron deficiency anemia, unspecified: Secondary | ICD-10-CM | POA: Diagnosis not present

## 2023-07-23 DIAGNOSIS — R413 Other amnesia: Secondary | ICD-10-CM | POA: Diagnosis not present

## 2023-08-08 DIAGNOSIS — R946 Abnormal results of thyroid function studies: Secondary | ICD-10-CM | POA: Diagnosis not present

## 2023-08-08 DIAGNOSIS — R7989 Other specified abnormal findings of blood chemistry: Secondary | ICD-10-CM | POA: Diagnosis not present

## 2023-08-21 DIAGNOSIS — R0683 Snoring: Secondary | ICD-10-CM | POA: Diagnosis not present

## 2023-08-31 DIAGNOSIS — Z20822 Contact with and (suspected) exposure to covid-19: Secondary | ICD-10-CM | POA: Diagnosis not present

## 2023-08-31 DIAGNOSIS — R5383 Other fatigue: Secondary | ICD-10-CM | POA: Diagnosis not present

## 2023-08-31 DIAGNOSIS — Z1152 Encounter for screening for COVID-19: Secondary | ICD-10-CM | POA: Diagnosis not present

## 2023-08-31 DIAGNOSIS — J029 Acute pharyngitis, unspecified: Secondary | ICD-10-CM | POA: Diagnosis not present

## 2023-08-31 DIAGNOSIS — J189 Pneumonia, unspecified organism: Secondary | ICD-10-CM | POA: Diagnosis not present

## 2023-09-04 DIAGNOSIS — R059 Cough, unspecified: Secondary | ICD-10-CM | POA: Diagnosis not present

## 2023-09-04 DIAGNOSIS — J029 Acute pharyngitis, unspecified: Secondary | ICD-10-CM | POA: Diagnosis not present

## 2023-09-15 DIAGNOSIS — Z136 Encounter for screening for cardiovascular disorders: Secondary | ICD-10-CM | POA: Diagnosis not present

## 2023-09-15 DIAGNOSIS — N939 Abnormal uterine and vaginal bleeding, unspecified: Secondary | ICD-10-CM | POA: Diagnosis not present

## 2023-09-15 DIAGNOSIS — E282 Polycystic ovarian syndrome: Secondary | ICD-10-CM | POA: Diagnosis not present

## 2023-09-15 DIAGNOSIS — Z6841 Body Mass Index (BMI) 40.0 and over, adult: Secondary | ICD-10-CM | POA: Diagnosis not present

## 2023-11-08 DIAGNOSIS — D259 Leiomyoma of uterus, unspecified: Secondary | ICD-10-CM | POA: Diagnosis not present

## 2023-11-11 DIAGNOSIS — E669 Obesity, unspecified: Secondary | ICD-10-CM | POA: Diagnosis not present

## 2023-11-11 DIAGNOSIS — Z6841 Body Mass Index (BMI) 40.0 and over, adult: Secondary | ICD-10-CM | POA: Diagnosis not present

## 2023-11-11 DIAGNOSIS — E282 Polycystic ovarian syndrome: Secondary | ICD-10-CM | POA: Diagnosis not present
